# Patient Record
Sex: Male | Born: 1965 | Race: White | Hispanic: No | Marital: Single | Smoking: Never smoker
Health system: Southern US, Community
[De-identification: ages and names within clinical notes are randomized; demographics above are authoritative.]

## PROBLEM LIST (undated history)

## (undated) DIAGNOSIS — J45909 Unspecified asthma, uncomplicated: Secondary | ICD-10-CM

## (undated) DIAGNOSIS — H52209 Unspecified astigmatism, unspecified eye: Secondary | ICD-10-CM

## (undated) DIAGNOSIS — F32A Depression, unspecified: Secondary | ICD-10-CM

## (undated) DIAGNOSIS — M479 Spondylosis, unspecified: Secondary | ICD-10-CM

## (undated) DIAGNOSIS — F1120 Opioid dependence, uncomplicated: Secondary | ICD-10-CM

## (undated) DIAGNOSIS — R4183 Borderline intellectual functioning: Secondary | ICD-10-CM

## (undated) DIAGNOSIS — F609 Personality disorder, unspecified: Secondary | ICD-10-CM

## (undated) DIAGNOSIS — H5210 Myopia, unspecified eye: Secondary | ICD-10-CM

## (undated) DIAGNOSIS — M5030 Other cervical disc degeneration, unspecified cervical region: Secondary | ICD-10-CM

## (undated) DIAGNOSIS — F329 Major depressive disorder, single episode, unspecified: Secondary | ICD-10-CM

## (undated) DIAGNOSIS — M109 Gout, unspecified: Secondary | ICD-10-CM

## (undated) DIAGNOSIS — F909 Attention-deficit hyperactivity disorder, unspecified type: Secondary | ICD-10-CM

## (undated) DIAGNOSIS — F341 Dysthymic disorder: Secondary | ICD-10-CM

## (undated) HISTORY — PX: FOOT SURGERY: SHX648

## (undated) HISTORY — DX: Borderline intellectual functioning: R41.83

## (undated) HISTORY — PX: BACK SURGERY: SHX140

## (undated) HISTORY — DX: Attention-deficit hyperactivity disorder, unspecified type: F90.9

## (undated) HISTORY — DX: Myopia, unspecified eye: H52.10

## (undated) HISTORY — DX: Spondylosis, unspecified: M47.9

## (undated) HISTORY — DX: Major depressive disorder, single episode, unspecified: F32.9

## (undated) HISTORY — DX: Personality disorder, unspecified: F60.9

## (undated) HISTORY — DX: Opioid dependence, uncomplicated: F11.20

## (undated) HISTORY — DX: Unspecified asthma, uncomplicated: J45.909

## (undated) HISTORY — DX: Depression, unspecified: F32.A

## (undated) HISTORY — DX: Unspecified astigmatism, unspecified eye: H52.209

## (undated) HISTORY — DX: Dysthymic disorder: F34.1

## (undated) HISTORY — DX: Other cervical disc degeneration, unspecified cervical region: M50.30

---

## 1998-12-21 ENCOUNTER — Emergency Department (HOSPITAL_COMMUNITY): Admission: EM | Admit: 1998-12-21 | Discharge: 1998-12-21 | Payer: Self-pay | Admitting: Emergency Medicine

## 1999-01-13 ENCOUNTER — Emergency Department (HOSPITAL_COMMUNITY): Admission: EM | Admit: 1999-01-13 | Discharge: 1999-01-13 | Payer: Self-pay | Admitting: Emergency Medicine

## 1999-10-10 ENCOUNTER — Encounter: Admission: RE | Admit: 1999-10-10 | Discharge: 1999-10-10 | Payer: Self-pay | Admitting: Internal Medicine

## 1999-10-17 ENCOUNTER — Encounter: Admission: RE | Admit: 1999-10-17 | Discharge: 1999-10-17 | Payer: Self-pay | Admitting: Internal Medicine

## 1999-10-22 ENCOUNTER — Ambulatory Visit (HOSPITAL_COMMUNITY): Admission: RE | Admit: 1999-10-22 | Discharge: 1999-10-22 | Payer: Self-pay | Admitting: *Deleted

## 1999-12-28 ENCOUNTER — Encounter: Admission: RE | Admit: 1999-12-28 | Discharge: 1999-12-28 | Payer: Self-pay | Admitting: Internal Medicine

## 2000-02-13 ENCOUNTER — Encounter: Admission: RE | Admit: 2000-02-13 | Discharge: 2000-02-13 | Payer: Self-pay | Admitting: Internal Medicine

## 2000-03-12 ENCOUNTER — Encounter: Admission: RE | Admit: 2000-03-12 | Discharge: 2000-03-12 | Payer: Self-pay | Admitting: Internal Medicine

## 2000-04-04 ENCOUNTER — Encounter: Admission: RE | Admit: 2000-04-04 | Discharge: 2000-04-04 | Payer: Self-pay | Admitting: Hematology and Oncology

## 2000-04-29 ENCOUNTER — Encounter: Admission: RE | Admit: 2000-04-29 | Discharge: 2000-04-29 | Payer: Self-pay | Admitting: Internal Medicine

## 2003-01-27 ENCOUNTER — Emergency Department (HOSPITAL_COMMUNITY): Admission: EM | Admit: 2003-01-27 | Discharge: 2003-01-27 | Payer: Self-pay | Admitting: Emergency Medicine

## 2003-01-31 ENCOUNTER — Emergency Department (HOSPITAL_COMMUNITY): Admission: EM | Admit: 2003-01-31 | Discharge: 2003-01-31 | Payer: Self-pay | Admitting: Emergency Medicine

## 2003-01-31 ENCOUNTER — Encounter: Payer: Self-pay | Admitting: Emergency Medicine

## 2003-02-01 ENCOUNTER — Emergency Department (HOSPITAL_COMMUNITY): Admission: EM | Admit: 2003-02-01 | Discharge: 2003-02-01 | Payer: Self-pay | Admitting: Emergency Medicine

## 2003-02-05 ENCOUNTER — Encounter: Payer: Self-pay | Admitting: Emergency Medicine

## 2003-02-05 ENCOUNTER — Emergency Department (HOSPITAL_COMMUNITY): Admission: EM | Admit: 2003-02-05 | Discharge: 2003-02-05 | Payer: Self-pay | Admitting: Emergency Medicine

## 2003-02-18 ENCOUNTER — Emergency Department (HOSPITAL_COMMUNITY): Admission: EM | Admit: 2003-02-18 | Discharge: 2003-02-18 | Payer: Self-pay | Admitting: Emergency Medicine

## 2003-03-08 ENCOUNTER — Encounter: Admission: RE | Admit: 2003-03-08 | Discharge: 2003-03-08 | Payer: Self-pay | Admitting: Family Medicine

## 2003-03-18 ENCOUNTER — Encounter: Admission: RE | Admit: 2003-03-18 | Discharge: 2003-03-18 | Payer: Self-pay | Admitting: Family Medicine

## 2003-03-23 ENCOUNTER — Encounter: Admission: RE | Admit: 2003-03-23 | Discharge: 2003-03-23 | Payer: Self-pay | Admitting: Family Medicine

## 2003-03-26 ENCOUNTER — Encounter: Admission: RE | Admit: 2003-03-26 | Discharge: 2003-03-26 | Payer: Self-pay | Admitting: Sports Medicine

## 2003-04-01 ENCOUNTER — Encounter: Admission: RE | Admit: 2003-04-01 | Discharge: 2003-04-01 | Payer: Self-pay | Admitting: Family Medicine

## 2003-04-26 ENCOUNTER — Encounter: Admission: RE | Admit: 2003-04-26 | Discharge: 2003-07-25 | Payer: Self-pay | Admitting: Family Medicine

## 2003-05-06 ENCOUNTER — Encounter: Admission: RE | Admit: 2003-05-06 | Discharge: 2003-05-06 | Payer: Self-pay | Admitting: Family Medicine

## 2003-05-20 ENCOUNTER — Encounter: Admission: RE | Admit: 2003-05-20 | Discharge: 2003-05-20 | Payer: Self-pay | Admitting: Family Medicine

## 2003-06-03 ENCOUNTER — Encounter: Admission: RE | Admit: 2003-06-03 | Discharge: 2003-06-03 | Payer: Self-pay | Admitting: Family Medicine

## 2003-08-18 ENCOUNTER — Emergency Department (HOSPITAL_COMMUNITY): Admission: EM | Admit: 2003-08-18 | Discharge: 2003-08-18 | Payer: Self-pay | Admitting: Emergency Medicine

## 2003-11-11 ENCOUNTER — Emergency Department (HOSPITAL_COMMUNITY): Admission: EM | Admit: 2003-11-11 | Discharge: 2003-11-11 | Payer: Self-pay | Admitting: Emergency Medicine

## 2004-05-02 ENCOUNTER — Ambulatory Visit: Payer: Self-pay | Admitting: Pediatric Critical Care Medicine

## 2004-05-17 ENCOUNTER — Emergency Department (HOSPITAL_COMMUNITY): Admission: AC | Admit: 2004-05-17 | Discharge: 2004-05-17 | Payer: Self-pay

## 2004-07-07 ENCOUNTER — Emergency Department (HOSPITAL_COMMUNITY): Admission: EM | Admit: 2004-07-07 | Discharge: 2004-07-07 | Payer: Self-pay | Admitting: Emergency Medicine

## 2005-06-04 ENCOUNTER — Emergency Department (HOSPITAL_COMMUNITY): Admission: EM | Admit: 2005-06-04 | Discharge: 2005-06-04 | Payer: Self-pay | Admitting: Emergency Medicine

## 2006-11-14 DIAGNOSIS — G609 Hereditary and idiopathic neuropathy, unspecified: Secondary | ICD-10-CM | POA: Insufficient documentation

## 2006-11-14 DIAGNOSIS — E739 Lactose intolerance, unspecified: Secondary | ICD-10-CM

## 2006-11-14 DIAGNOSIS — M1990 Unspecified osteoarthritis, unspecified site: Secondary | ICD-10-CM | POA: Insufficient documentation

## 2006-11-14 DIAGNOSIS — E669 Obesity, unspecified: Secondary | ICD-10-CM

## 2006-11-14 DIAGNOSIS — R740 Nonspecific elevation of levels of transaminase and lactic acid dehydrogenase [LDH]: Secondary | ICD-10-CM

## 2006-11-14 DIAGNOSIS — M109 Gout, unspecified: Secondary | ICD-10-CM | POA: Insufficient documentation

## 2006-11-14 DIAGNOSIS — H9190 Unspecified hearing loss, unspecified ear: Secondary | ICD-10-CM | POA: Insufficient documentation

## 2007-09-09 ENCOUNTER — Emergency Department (HOSPITAL_COMMUNITY): Admission: EM | Admit: 2007-09-09 | Discharge: 2007-09-10 | Payer: Self-pay | Admitting: Emergency Medicine

## 2011-06-22 LAB — DIFFERENTIAL
Eosinophils Absolute: 0 — ABNORMAL LOW
Lymphocytes Relative: 23
Monocytes Absolute: 0.6
Neutro Abs: 6

## 2011-06-22 LAB — CBC
Hemoglobin: 14.6
Platelets: 192
RDW: 13.1
WBC: 8.7

## 2011-06-22 LAB — COMPREHENSIVE METABOLIC PANEL
AST: 45 — ABNORMAL HIGH
Alkaline Phosphatase: 60
BUN: 7
CO2: 28
Calcium: 9.9
GFR calc Af Amer: >60
Potassium: 3.4 — ABNORMAL LOW
Total Protein: 7.6

## 2011-06-22 LAB — RAPID URINE DRUG SCREEN, HOSP PERFORMED: Barbiturates: NOT DETECTED

## 2013-01-14 ENCOUNTER — Emergency Department (HOSPITAL_COMMUNITY)
Admission: EM | Admit: 2013-01-14 | Discharge: 2013-01-15 | Disposition: A | Discharge status: Home / Self Care | Payer: Medicare Other | Attending: Emergency Medicine | Admitting: Emergency Medicine

## 2013-01-14 ENCOUNTER — Encounter (HOSPITAL_COMMUNITY): Payer: Self-pay | Admitting: *Deleted

## 2013-01-14 DIAGNOSIS — M25469 Effusion, unspecified knee: Secondary | ICD-10-CM | POA: Insufficient documentation

## 2013-01-14 DIAGNOSIS — R5381 Other malaise: Secondary | ICD-10-CM | POA: Insufficient documentation

## 2013-01-14 DIAGNOSIS — M25569 Pain in unspecified knee: Secondary | ICD-10-CM | POA: Insufficient documentation

## 2013-01-14 DIAGNOSIS — R5383 Other fatigue: Secondary | ICD-10-CM | POA: Insufficient documentation

## 2013-01-14 DIAGNOSIS — R109 Unspecified abdominal pain: Secondary | ICD-10-CM | POA: Insufficient documentation

## 2013-01-14 DIAGNOSIS — R51 Headache: Secondary | ICD-10-CM | POA: Insufficient documentation

## 2013-01-14 DIAGNOSIS — R000 Tachycardia, unspecified: Secondary | ICD-10-CM | POA: Insufficient documentation

## 2013-01-14 DIAGNOSIS — R509 Fever, unspecified: Secondary | ICD-10-CM | POA: Insufficient documentation

## 2013-01-14 DIAGNOSIS — M25579 Pain in unspecified ankle and joints of unspecified foot: Secondary | ICD-10-CM | POA: Insufficient documentation

## 2013-01-14 DIAGNOSIS — M109 Gout, unspecified: Secondary | ICD-10-CM | POA: Insufficient documentation

## 2013-01-14 HISTORY — DX: Gout, unspecified: M10.9

## 2013-01-14 LAB — CBC WITH DIFFERENTIAL/PLATELET
Basophils Absolute: 0 10*3/uL (ref 0.0–0.1)
Eosinophils Absolute: 0 10*3/uL (ref 0.0–0.7)
Eosinophils Relative: 0 % (ref 0–5)
HCT: 44.2 % (ref 39.0–52.0)
Lymphocytes Relative: 12 % (ref 12–46)
Lymphs Abs: 1.4 10*3/uL (ref 0.7–4.0)
MCH: 28.4 pg (ref 26.0–34.0)
MCV: 84.4 fL (ref 78.0–100.0)
Neutro Abs: 8.8 10*3/uL — ABNORMAL HIGH (ref 1.7–7.7)
Neutrophils Relative %: 76 % (ref 43–77)
Platelets: 192 10*3/uL (ref 150–400)
RDW: 12.9 % (ref 11.5–15.5)

## 2013-01-14 LAB — COMPREHENSIVE METABOLIC PANEL
AST: 30 U/L (ref 0–37)
Albumin: 3.9 g/dL (ref 3.5–5.2)
Alkaline Phosphatase: 89 U/L (ref 39–117)
Chloride: 97 mEq/L (ref 96–112)
Creatinine, Ser: 0.83 mg/dL (ref 0.50–1.35)
GFR calc non Af Amer: >90 mL/min (ref >90)
Potassium: 4.5 mEq/L (ref 3.5–5.1)
Total Bilirubin: 0.8 mg/dL (ref 0.3–1.2)

## 2013-01-14 MED ORDER — KETOROLAC TROMETHAMINE 30 MG/ML IJ SOLN
30.0000 mg | Freq: Once | INTRAMUSCULAR | Status: AC
  Administered 2013-01-14: 30 mg via INTRAVENOUS
  Filled 2013-01-14: qty 1

## 2013-01-14 MED ORDER — SODIUM CHLORIDE 0.9 % IV BOLUS (SEPSIS)
1000.0000 mL | Freq: Once | INTRAVENOUS | Status: AC
  Administered 2013-01-14: 1000 mL via INTRAVENOUS

## 2013-01-14 MED ORDER — HYDROCODONE-ACETAMINOPHEN 5-325 MG PO TABS: 1.0000 | ORAL_TABLET | ORAL | Status: DC | PRN

## 2013-01-14 MED ORDER — MORPHINE SULFATE 4 MG/ML IJ SOLN
4.0000 mg | Freq: Once | INTRAMUSCULAR | Status: AC
  Administered 2013-01-14: 4 mg via INTRAVENOUS
  Filled 2013-01-14: qty 1

## 2013-01-14 MED ORDER — INDOMETHACIN 25 MG PO CAPS: 25.0000 mg | ORAL_CAPSULE | Freq: Three times a day (TID) | ORAL | Status: DC | PRN

## 2013-01-14 MED ORDER — ALLOPURINOL 100 MG PO TABS: 100.0000 mg | ORAL_TABLET | Freq: Every day | ORAL | Status: DC

## 2013-01-14 NOTE — ED Provider Notes (Signed)
History     CSN: [id number]  Arrival date & time 01/14/13  1191   First MD Initiated Contact with Patient 01/14/13 2020      Chief Complaint  Patient presents with  . Gout    (Consider location/radiation/quality/duration/timing/severity/associated sxs/prior treatment) HPI Pt with 3 weeks of waxing and waning L knee pain, swelling, warmth. Pt has history of gout with similar symptoms. States he has been trying alternative therapies with some success. Pain has worsened in the past few days and now he is having pain in the MTP joint of first digit of the left foot. +fever and chills.  Past Medical History  Diagnosis Date  . Gout     Past Surgical History  Procedure Laterality Date  . Back surgery      2006    History reviewed. No pertinent family history.  History  Substance Use Topics  . Smoking status: Not on file  . Smokeless tobacco: Not on file  . Alcohol Use: Yes      Review of Systems  Constitutional: Positive for fever, chills and fatigue.  HENT: Negative for neck pain and neck stiffness.   Respiratory: Negative for shortness of breath.   Cardiovascular: Negative for chest pain.  Gastrointestinal: Positive for abdominal pain. Negative for nausea, vomiting and diarrhea.  Musculoskeletal: Negative for myalgias and back pain.  Skin: Negative for rash and wound.  Neurological: Positive for headaches. Negative for dizziness, seizures, syncope, weakness and numbness.  All other systems reviewed and are negative.    Allergies  Aspirin adult low  Home Medications   Current Outpatient Rx  Name  Route  Sig  Dispense  Refill  . phenyltoloxamine-acetaminophen 30-325 MG per tablet   Oral   Take 2 tablets by mouth 3 (three) times daily as needed for pain.         Marland Kitchen allopurinol (ZYLOPRIM) 100 MG tablet   Oral   Take 1 tablet (100 mg total) by mouth daily.   15 tablet   0   . HYDROcodone-acetaminophen (NORCO) 5-325 MG per tablet   Oral   Take 1 tablet by  mouth every 4 (four) hours as needed for pain.   15 tablet   0   . indomethacin (INDOCIN) 25 MG capsule   Oral   Take 1 capsule (25 mg total) by mouth 3 (three) times daily as needed.   30 capsule   0     BP 109/78  Pulse 96  Temp(Src) 100.1 F (37.8 C) (Oral)  Resp 16  SpO2 94%  Physical Exam  Nursing note and vitals reviewed. Constitutional: He is oriented to person, place, and time. He appears well-developed and well-nourished. No distress.  HENT:  Head: Normocephalic and atraumatic.  Mouth/Throat: Oropharynx is clear and moist.  Eyes: EOM are normal. Pupils are equal, round, and reactive to light.  Neck: Normal range of motion. Neck supple.  No meningismus   Cardiovascular: Regular rhythm.   tachycardia  Pulmonary/Chest: Effort normal and breath sounds normal. No respiratory distress. He has no wheezes. He has no rales. He exhibits no tenderness.  Abdominal: Soft. Bowel sounds are normal. He exhibits no distension. There is tenderness (mild LUQ TTP. No rebound or guarding).  Musculoskeletal: Normal range of motion. He exhibits tenderness (TTP and with ROM of L knee and R MTP of 1st digit of foot. +swelling and warmth). He exhibits no edema.  Neurological: He is alert and oriented to person, place, and time.  5/5 motor,sensation intact  Skin:  Skin is warm and dry. No rash noted. No erythema.  Psychiatric: He has a normal mood and affect. His behavior is normal.    ED Course  Procedures (including critical care time)  Labs Reviewed  COMPREHENSIVE METABOLIC PANEL - Abnormal; Notable for the following:    Glucose, Bld 116 (*)    All other components within normal limits  CBC WITH DIFFERENTIAL - Abnormal; Notable for the following:    WBC 11.6 (*)    Neutro Abs 8.8 (*)    Monocytes Absolute 1.4 (*)    All other components within normal limits  URIC ACID   No results found.   1. Gout flare       MDM   HR improved with IVF NSAIDS. Pt states pain is  improved.        Loren Racer, MD 01/14/13 [phone]

## 2013-01-14 NOTE — ED Notes (Signed)
Pt c/o pain to left knee and left ankle. Reports diagnosed with gout in 2003.

## 2013-01-14 NOTE — ED Notes (Signed)
Pt arrives from home by EMS. Pt c/o gout pain to left knee and right ankle.

## 2013-01-30 ENCOUNTER — Ambulatory Visit: Payer: Medicare Other | Attending: Family Medicine | Admitting: Family Medicine

## 2013-01-30 VITALS — BP 121/78 | HR 99 | Temp 99.0°F | Resp 18 | Wt 250.0 lb

## 2013-01-30 DIAGNOSIS — M1990 Unspecified osteoarthritis, unspecified site: Secondary | ICD-10-CM

## 2013-01-30 DIAGNOSIS — E669 Obesity, unspecified: Secondary | ICD-10-CM

## 2013-01-30 DIAGNOSIS — R7309 Other abnormal glucose: Secondary | ICD-10-CM

## 2013-01-30 DIAGNOSIS — E739 Lactose intolerance, unspecified: Secondary | ICD-10-CM

## 2013-01-30 DIAGNOSIS — M79674 Pain in right toe(s): Secondary | ICD-10-CM

## 2013-01-30 DIAGNOSIS — M25562 Pain in left knee: Secondary | ICD-10-CM

## 2013-01-30 DIAGNOSIS — R7303 Prediabetes: Secondary | ICD-10-CM

## 2013-01-30 DIAGNOSIS — R739 Hyperglycemia, unspecified: Secondary | ICD-10-CM

## 2013-01-30 DIAGNOSIS — M2390 Unspecified internal derangement of unspecified knee: Secondary | ICD-10-CM

## 2013-01-30 DIAGNOSIS — M109 Gout, unspecified: Secondary | ICD-10-CM

## 2013-01-30 DIAGNOSIS — R7401 Elevation of levels of liver transaminase levels: Secondary | ICD-10-CM

## 2013-01-30 DIAGNOSIS — M25569 Pain in unspecified knee: Secondary | ICD-10-CM

## 2013-01-30 DIAGNOSIS — M79609 Pain in unspecified limb: Secondary | ICD-10-CM

## 2013-01-30 DIAGNOSIS — M2392 Unspecified internal derangement of left knee: Secondary | ICD-10-CM

## 2013-01-30 MED ORDER — HYDROCODONE-ACETAMINOPHEN 5-325 MG PO TABS: 1.0000 | ORAL_TABLET | Freq: Three times a day (TID) | ORAL | Status: DC | PRN

## 2013-01-30 MED ORDER — PREDNISONE 20 MG PO TABS: ORAL_TABLET | ORAL | Status: DC

## 2013-01-30 MED ORDER — METHYLPREDNISOLONE SODIUM SUCC 125 MG IJ SOLR
125.0000 mg | Freq: Once | INTRAMUSCULAR | Status: AC
  Administered 2013-01-30: 125 mg via INTRAMUSCULAR

## 2013-01-30 MED ORDER — METHYLPREDNISOLONE SODIUM SUCC 1000 MG IJ SOLR: 125.0000 mg | Freq: Once | INTRAMUSCULAR | Status: DC

## 2013-01-30 NOTE — Progress Notes (Signed)
Patient ID: Drew [name], male   DOB: November 23, 1965, 47 y.o.   MRN: [id number] CC:  Gout attack   HPI: Patient states suffers from gout and was recently treated in ER and started on allopurinol and indocin. Has had a flair up in his left knee for about a month.  He treated it at home and then he went to ER after symptoms lasted 2 weeks at home.  He was treated in ER and improved with IVFs and pain meds. He started allopurinol and indocin and reported that he had a recurrent flare up involving right great toe and left knee continues to hurt, remain swollen and decreased mobility (although improved from when he was in ER) it has not improved to normal.  He says that he has had knee injections of steroids in the past from gout.  He says he currently does not have an orthopedist and has not seen a primary care provider in a very long time.  He denies other known medical problems.  He denies fever and chills.      Allergies  Allergen Reactions  . Aspirin Adult Low (Aspirin) Nausea Only   Past Medical History  Diagnosis Date  . Gout    Current Outpatient Prescriptions on File Prior to Visit  Medication Sig Dispense Refill  . allopurinol (ZYLOPRIM) 100 MG tablet Take 1 tablet (100 mg total) by mouth daily.  15 tablet  0   No current facility-administered medications on file prior to visit.   No family history on file. History   Social History  . Marital Status: Single    Spouse Name: N/A    Number of Children: N/A  . Years of Education: N/A   Occupational History  . Not on file.   Social History Main Topics  . Smoking status: Not on file  . Smokeless tobacco: Not on file  . Alcohol Use: Yes  . Drug Use: No  . Sexually Active: Not on file   Other Topics Concern  . Not on file   Social History Narrative  . No narrative on file    Review of Systems  Constitutional: Negative for fever, chills, diaphoresis, activity change, appetite change and fatigue.  HENT: Negative for ear pain,  nosebleeds, congestion, facial swelling, rhinorrhea, neck pain, neck stiffness and ear discharge.   Eyes: Negative for pain, discharge, redness, itching and visual disturbance.  Respiratory: Negative for cough, choking, chest tightness, shortness of breath, wheezing and stridor.   Cardiovascular: Negative for chest pain, palpitations and leg swelling.  Gastrointestinal: Negative for abdominal distention.  Genitourinary: Negative for dysuria, urgency, frequency, hematuria, flank pain, decreased urine volume, difficulty urinating and dyspareunia.  Musculoskeletal: pain and swelling in left knee right 1st toe Neurological: Negative for dizziness, tremors, seizures, syncope, facial asymmetry, speech difficulty, weakness, light-headedness, numbness and headaches.  Hematological: Negative for adenopathy. Does not bruise/bleed easily.  Psychiatric/Behavioral: Negative for hallucinations, behavioral problems, confusion, dysphoric mood, decreased concentration and agitation.    Objective:   Filed Vitals:   01/30/13 1611  BP: 121/78  Pulse: 99  Temp: 99 F (37.2 C)  Resp: 18   Physical Exam  Constitutional: Appears well-developed and well-nourished. No distress.  HENT: Normocephalic. External right and left ear normal. Oropharynx is clear and moist.  Eyes: Conjunctivae and EOM are normal. PERRLA, no scleral icterus.  Neck: Normal ROM. Neck supple. No JVD. No tracheal deviation. No thyromegaly.  CVS: RRR, S1/S2 +, no murmurs, no gallops, no carotid bruit.  Pulmonary: Effort and  breath sounds normal, no stridor, rhonchi, wheezes, rales.  Abdominal: Soft. BS +,  no distension, tenderness, rebound or guarding.  Musculoskeletal: limited mobility of left knee due to pain, mild edema of left knee, no redness seen, mild warmth of inflammation of left knee, He exhibits tenderness R MTP of 1st digit of foot.   Lymphadenopathy: No lymphadenopathy noted, cervical, inguinal. Neuro: Alert. Normal reflexes,  muscle tone coordination. No cranial nerve deficit. Skin: Skin is warm and dry. No rash noted. Not diaphoretic. No erythema. No pallor.  Psychiatric: Normal mood and affect. Behavior, judgment, thought content normal.   Lab Results  Component Value Date   WBC 11.6* 01/14/2013   HGB 14.9 01/14/2013   HCT 44.2 01/14/2013   MCV 84.4 01/14/2013   PLT 192 01/14/2013   Lab Results  Component Value Date   CREATININE 0.83 01/14/2013   BUN 8 01/14/2013   NA 140 01/14/2013   K 4.5 01/14/2013   CL 97 01/14/2013   CO2 30 01/14/2013    No results found for this basename: HGBA1C     Assessment:   Patient Active Problem List   Diagnosis Date Noted  . GLUCOSE INTOLERANCE 11/14/2006  . GOUT, ACUTE 11/14/2006  . OBESITY, NOS 11/14/2006  . NEUROPATHY, PERIPHERAL 11/14/2006  . HEARING LOSS NOS OR DEAFNESS 11/14/2006  . DJD, UNSPECIFIED 11/14/2006  . TRANSAMINESES ELEVATED 11/14/2006        Plan:        Gout attack - Plan: methylPREDNISolone sodium succinate (SOLU-MEDROL) 125 mg IM, check labs.  I had a long discussion with patient that if he develops fever, chills, nausea or vomiting or worsening pain or swelling that he should immediately go to ER.  The patient verbalized understanding.  I have recommended that he see an orthopedist for further evaluation and for possible knee aspiration.  I am placing him on a short prednisone course. DC indomethacin and DC allopurinol at this time.  Check more labs today.  Arrange for close follow up in our office.   The patient was given clear instructions to go to ER or return to medical center if symptoms don't improve, worsen or new problems develop.  The patient verbalized understanding.  The patient was told to call to get lab results if they haven't heard anything in the next week.    Results for orders placed in visit on 01/30/13  URIC ACID      Result Value Range   Uric Acid, Serum 7.4  4.0 - 7.8 mg/dL  HEMOGLOBIN J1B      Result Value Range    Hemoglobin A1C 5.9 (*) <5.7 %   Mean Plasma Glucose 123 (*) <117 mg/dL  COMPLETE METABOLIC PANEL WITH GFR      Result Value Range   Sodium 139  135 - 145 mEq/L   Potassium 4.1  3.5 - 5.3 mEq/L   Chloride 101  96 - 112 mEq/L   CO2 27  19 - 32 mEq/L   Glucose, Bld 89  70 - 99 mg/dL   BUN 10  6 - 23 mg/dL   Creat 1.47  8.29 - 5.62 mg/dL   Total Bilirubin 0.7  0.3 - 1.2 mg/dL   Alkaline Phosphatase 86  39 - 117 U/L   AST 57 (*) 0 - 37 U/L   ALT 63 (*) 0 - 53 U/L   Total Protein 7.8  6.0 - 8.3 g/dL   Albumin 4.5  3.5 - 5.2 g/dL   Calcium 10.2  8.4 - 10.5 mg/dL   GFR, Est African American >89     GFR, Est Non African American >89

## 2013-01-30 NOTE — Patient Instructions (Signed)
Stop taking the allopurinol and stop taking the indomethacin  Gout Gout is an inflammatory condition (arthritis) caused by a buildup of uric acid crystals in the joints. Uric acid is a chemical that is normally present in the blood. Under some circumstances, uric acid can form into crystals in your joints. This causes joint redness, soreness, and swelling (inflammation). Repeat attacks are common. Over time, uric acid crystals can form into masses (tophi) near a joint, causing disfigurement. Gout is treatable and often preventable. CAUSES  The disease begins with elevated levels of uric acid in the blood. Uric acid is produced by your body when it breaks down a naturally found substance called purines. This also happens when you eat certain foods such as meats and fish. Causes of an elevated uric acid level include:  Being passed down from parent to child (heredity).  Diseases that cause increased uric acid production (obesity, psoriasis, some cancers).  Excessive alcohol use.  Diet, especially diets rich in meat and seafood.  Medicines, including certain cancer-fighting drugs (chemotherapy), diuretics, and aspirin.  Chronic kidney disease. The kidneys are no longer able to remove uric acid well.  Problems with metabolism. Conditions strongly associated with gout include:  Obesity.  High blood pressure.  High cholesterol.  Diabetes. Not everyone with elevated uric acid levels gets gout. It is not understood why some people get gout and others do not. Surgery, joint injury, and eating too much of certain foods are some of the factors that can lead to gout. SYMPTOMS   An attack of gout comes on quickly. It causes intense pain with redness, swelling, and warmth in a joint.  Fever can occur.  Often, only one joint is involved. Certain joints are more commonly involved:  Base of the big toe.  Knee.  Ankle.  Wrist.  Finger. Without treatment, an attack usually goes away in a  few days to weeks. Between attacks, you usually will not have symptoms, which is different from many other forms of arthritis. DIAGNOSIS  Your caregiver will suspect gout based on your symptoms and exam. Removal of fluid from the joint (arthrocentesis) is done to check for uric acid crystals. Your caregiver will give you a medicine that numbs the area (local anesthetic) and use a needle to remove joint fluid for exam. Gout is confirmed when uric acid crystals are seen in joint fluid, using a special microscope. Sometimes, blood, urine, and X-ray tests are also used. TREATMENT  There are 2 phases to gout treatment: treating the sudden onset (acute) attack and preventing attacks (prophylaxis). Treatment of an Acute Attack  Medicines are used. These include anti-inflammatory medicines or steroid medicines.  An injection of steroid medicine into the affected joint is sometimes necessary.  The painful joint is rested. Movement can worsen the arthritis.  You may use warm or cold treatments on painful joints, depending which works best for you.  Discuss the use of coffee, vitamin C, or cherries with your caregiver. These may be helpful treatment options. Treatment to Prevent Attacks After the acute attack subsides, your caregiver may advise prophylactic medicine. These medicines either help your kidneys eliminate uric acid from your body or decrease your uric acid production. You may need to stay on these medicines for a very long time. The early phase of treatment with prophylactic medicine can be associated with an increase in acute gout attacks. For this reason, during the first few months of treatment, your caregiver may also advise you to take medicines usually used for  acute gout treatment. Be sure you understand your caregiver's directions. You should also discuss dietary treatment with your caregiver. Certain foods such as meats and fish can increase uric acid levels. Other foods such as dairy  can decrease levels. Your caregiver can give you a list of foods to avoid. HOME CARE INSTRUCTIONS   Do not take aspirin to relieve pain. This raises uric acid levels.  Only take over-the-counter or prescription medicines for pain, discomfort, or fever as directed by your caregiver.  Rest the joint as much as possible. When in bed, keep sheets and blankets off painful areas.  Keep the affected joint raised (elevated).  Use crutches if the painful joint is in your leg.  Drink enough water and fluids to keep your urine clear or pale yellow. This helps your body get rid of uric acid. Do not drink alcoholic beverages. They slow the passage of uric acid.  Follow your caregiver's dietary instructions. Pay careful attention to the amount of protein you eat. Your daily diet should emphasize fruits, vegetables, whole grains, and fat-free or low-fat milk products.  Maintain a healthy body weight. SEEK MEDICAL CARE IF:   You have an oral temperature above 102 F (38.9 C).  You develop diarrhea, vomiting, or any side effects from medicines.  You do not feel better in 24 hours, or you are getting worse. SEEK IMMEDIATE MEDICAL CARE IF:   Your joint becomes suddenly more tender and you have:  Chills.  An oral temperature above 102 F (38.9 C), not controlled by medicine. MAKE SURE YOU:   Understand these instructions.  Will watch your condition.  Will get help right away if you are not doing well or get worse. Document Released: 08/31/2000 Document Revised: 11/26/2011 Document Reviewed: 12/12/2009 [facility] Patient Information 2013 [place], [place].

## 2013-01-30 NOTE — Progress Notes (Signed)
Patient states no reaction to solumedrol injection.

## 2013-01-30 NOTE — Progress Notes (Signed)
Patient states suffers from gout Has had a flair up in his left knee for about a month Also suffers from gout in his great right tow

## 2013-01-31 DIAGNOSIS — R7303 Prediabetes: Secondary | ICD-10-CM | POA: Insufficient documentation

## 2013-01-31 DIAGNOSIS — M25562 Pain in left knee: Secondary | ICD-10-CM | POA: Insufficient documentation

## 2013-01-31 LAB — COMPLETE METABOLIC PANEL WITH GFR
ALT: 63 U/L — ABNORMAL HIGH (ref 0–53)
Albumin: 4.5 g/dL (ref 3.5–5.2)
CO2: 27 mEq/L (ref 19–32)
Calcium: 10.2 mg/dL (ref 8.4–10.5)
Chloride: 101 mEq/L (ref 96–112)
Creat: 0.86 mg/dL (ref 0.50–1.35)
GFR, Est African American: >89 mL/min
GFR, Est Non African American: >89 mL/min
Potassium: 4.1 mEq/L (ref 3.5–5.3)

## 2013-01-31 NOTE — Progress Notes (Addendum)
Quick Note:  Please inform patient that his liver enzymes are elevated. This is most likely from the allopurinol that he was taking. He should have stopped taking that. His A1c was elevated suggesting that he does have prediabetes. He should start a low carb diet and increase physical activity. His uric acid level was within normal limits. Please send for his old records from his previous providers.   Rodney Langton, MD, CDE, FAAFP [facility] [place], [place]   ______

## 2013-02-02 ENCOUNTER — Telehealth: Payer: Self-pay

## 2013-02-02 NOTE — Telephone Encounter (Signed)
Left message to return out call 

## 2013-02-04 ENCOUNTER — Ambulatory Visit (HOSPITAL_BASED_OUTPATIENT_CLINIC_OR_DEPARTMENT_OTHER): Payer: Medicare Other | Admitting: Family Medicine

## 2013-02-04 ENCOUNTER — Ambulatory Visit
Admission: RE | Admit: 2013-02-04 | Discharge: 2013-02-04 | Disposition: A | Discharge status: Home / Self Care | Payer: Medicare Other | Source: Ambulatory Visit | Attending: Family Medicine | Admitting: Family Medicine

## 2013-02-04 VITALS — BP 140/84 | Ht 72.0 in | Wt 245.0 lb

## 2013-02-04 DIAGNOSIS — M25569 Pain in unspecified knee: Secondary | ICD-10-CM

## 2013-02-04 DIAGNOSIS — M25562 Pain in left knee: Secondary | ICD-10-CM

## 2013-02-04 NOTE — Patient Instructions (Addendum)
Thank you for coming in today. We will get xrays and try physical therapy.  Please come back in 1 month.  Come back sooner if not getting better.  If your gout returns after prednisone call here or your primary care doctor to start on Colchicine.

## 2013-02-04 NOTE — Progress Notes (Signed)
Drew [name] is a 47 y.o. male who presents to [facility] today for left knee pain. Patient has a personal history of gout. He typically has gout flares involving his left knee and right first MTP.   He had a gout flare started about one month ago. This involved his left knee. This became painful and developed an effusion. This was treated with indomethacin. Indomethacin works somewhat and he followed up with his primary care provider who switched to prednisone. He is currently partly throw a prednisone dose taper. He notes his knee pain and swelling have resolved however he cannot flex his left knee more than about 90 currently. He feels tension and pain on the anterior aspect of his knee with knee flexion. He feels well otherwise. He notes this happened previously and ultimately required manipulation under anesthesia. He denies any injury to his knee. He feels well otherwise.    PMH reviewed. Gout History  Substance Use Topics  . Smoking status: Not on file  . Smokeless tobacco: Not on file  . Alcohol Use: Yes   ROS as above otherwise neg   Exam:  BP 140/84  Ht 6' (1.829 m)  Wt 245 lb (111.131 kg)  BMI 33.22 kg/m2 Gen: Well NAD MSK: Left knee. Normal-appearing no erythema.  Mild effusion present Range of motion 0-90 No retropatellar crepitations on extension Nontender Stable ligamentous exam Extensor strength is intact.   No results found.

## 2013-02-04 NOTE — Assessment & Plan Note (Signed)
Unsure of etiology.  Patient is currently on a prednisone course therefore do not feel that corticosteroid injection would be very beneficial.  Plan for x-rays And trial of physical therapy Return in one month.  If not improved consider MRI to evaluate for meniscus injury

## 2013-02-05 ENCOUNTER — Other Ambulatory Visit: Payer: Self-pay | Admitting: Family Medicine

## 2013-02-11 ENCOUNTER — Ambulatory Visit: Payer: Medicare Other | Attending: Internal Medicine | Admitting: Internal Medicine

## 2013-02-11 ENCOUNTER — Encounter: Payer: Self-pay | Admitting: Internal Medicine

## 2013-02-11 VITALS — BP 127/93 | HR 69 | Temp 98.7°F | Resp 18 | Wt 253.4 lb

## 2013-02-11 DIAGNOSIS — M25569 Pain in unspecified knee: Secondary | ICD-10-CM

## 2013-02-11 DIAGNOSIS — M25562 Pain in left knee: Secondary | ICD-10-CM

## 2013-02-11 DIAGNOSIS — M109 Gout, unspecified: Secondary | ICD-10-CM | POA: Insufficient documentation

## 2013-02-11 MED ORDER — NAPROXEN 500 MG PO TABS: 500.0000 mg | ORAL_TABLET | Freq: Two times a day (BID) | ORAL | Status: AC

## 2013-02-11 MED ORDER — PREDNISONE 20 MG PO TABS: ORAL_TABLET | ORAL | Status: DC

## 2013-02-11 MED ORDER — HYDROCODONE-ACETAMINOPHEN 5-325 MG PO TABS: 1.0000 | ORAL_TABLET | Freq: Three times a day (TID) | ORAL | Status: AC | PRN

## 2013-02-11 MED ORDER — COLCHICINE 0.6 MG PO TABS: 0.6000 mg | ORAL_TABLET | Freq: Every day | ORAL | Status: AC

## 2013-02-11 NOTE — Progress Notes (Signed)
Patient ID: Drew Drew [name], male   DOB: Dec 09, 1965, 47 y.o.   MRN: [id number] Patient Demographics  Drew Drew [name], is a 47 y.o. male  NWG:[id number]  [id number]  DOB - [birth year]  Chief Complaint  Patient presents with  . Gout        Subjective:   Drew Drew [name] is here for a follow up visit. Patient has No headache, No chest pain, No abdominal pain - No Nausea, No new weakness tingling or numbness, No Cough - SOB. Patient is having acute gout attack in his left knee with no significant improvement after prednisone. Labs reviewed his serum uric acid was 7.4 on 01/30/2013. Allopurinol was discontinued due to mild transaminitis.He has an appointment with orthopedics, [name].   Objective:    Filed Vitals:   02/11/13 1306  BP: 127/93  Pulse: 69  Temp: 98.7 F (37.1 C)  Resp: 18  Weight: 253 lb 6.4 oz (114.941 kg)  SpO2: 97%     ALLERGIES:   Allergies  Allergen Reactions  . Aspirin Adult Low (Aspirin) Nausea Only    PAST MEDICAL HISTORY: Past Medical History  Diagnosis Date  . Gout     MEDICATIONS AT HOME: Prior to Admission medications   Medication Sig Start Date End Date Taking? Authorizing Provider  colchicine 0.6 MG tablet Take 1 tablet (0.6 mg total) by mouth daily. Take 2 tabs (1.2mg ) Drew [name], then 0.6mg  daily for 7 days 02/11/13   [name], MD  HYDROcodone-acetaminophen (NORCO) 5-325 MG per tablet Take 1 tablet by mouth every 8 (eight) hours as needed for pain. 02/11/13   [name], MD  naproxen (NAPROSYN) 500 MG tablet Take 1 tablet (500 mg total) by mouth 2 (two) times daily with a meal. 02/11/13   [name], MD  predniSONE (DELTASONE) 20 MG tablet Take 3 tabs PO QAM x3days, 2 tabs PO QAM x3days, 1tab PO QAM x3days 02/11/13   [name], MD     Exam  General appearance :Awake, alert, NAD, Speech Clear.  HEENT: Atraumatic and Normocephalic, PERLA Neck: supple, no JVD. No cervical lymphadenopathy.  Chest: Clear to  auscultation bilaterally, no wheezing, rales or rhonchi CVS: S1 S2 regular, no murmurs.  Abdomen: soft, NBS, NT, ND, no gaurding, rigidity or rebound. Extremities: no cyanosis or clubbing, left knee swollen, ROM decreased due to pain Neurology: Awake alert, and oriented X 3, CN II-XII intact, Non focal Skin: No Rash or lesions Wounds:N/A    Data Review   Basic Metabolic Panel: No results found for this basename: NA, K, CL, CO2, GLUCOSE, BUN, CREATININE, CALCIUM, MG, PHOS,  in the last 168 hours Liver Function Tests: No results found for this basename: AST, ALT, ALKPHOS, BILITOT, PROT, ALBUMIN,  in the last 168 hours  CBC: No results found for this basename: WBC, NEUTROABS, HGB, HCT, MCV, PLT,  in the last 168 hours  ------------------------------------------------------------------------------------------------------------------ No results found for this basename: HGBA1C,  in the last 72 hours ------------------------------------------------------------------------------------------------------------------ No results found for this basename: CHOL, HDL, LDLCALC, TRIG, CHOLHDL, LDLDIRECT,  in the last 72 hours ------------------------------------------------------------------------------------------------------------------ No results found for this basename: TSH, T4TOTAL, FREET3, T3FREE, THYROIDAB,  in the last 72 hours ------------------------------------------------------------------------------------------------------------------ No results found for this basename: VITAMINB12, FOLATE, FERRITIN, TIBC, IRON, RETICCTPCT,  in the last 72 hours  Coagulation profile  No results found for this basename: INR, PROTIME,  in the last 168 hours    Assessment & Plan   Active Problems: Acute gout of the left  knee pain - Placed on colchicine 0.6mg  X2 Drew [name], then continue daily - Placed on Naprosyn, percocet (#20, no refills) and prednisone taper - He has appointment with orthopedics, Dr.  Earma Reading on 02/18/13, patient advised to keep the appt   Follow-up in 3 months     [name] 02/11/2013, 1:37 PM

## 2013-02-11 NOTE — Progress Notes (Signed)
Patient states had a gout flair up to the left knee last night

## 2013-02-11 NOTE — Patient Instructions (Addendum)
Gout  Gout is caused by a buildup of uric acid crystals in the joints. The crystals make your joints sore. This is like having sand in your joints. Repeat attacks are common. Gout can be treated.  HOME CARE   · Do not take aspirin for pain.  · Only take medicine as told by your doctor.  · You may use cold treatments (ice) on painful joints.  · Put ice in a plastic bag.  · Place a towel between your skin and the bag.  · Leave the ice on for 15-20 minutes at a time, 3-4 times a day.  · Rest in bed as much as possible. When in bed, keep the sheets and blankets off your sore joints.  · Keep the sore joints raised (elevated).  · Use crutches if your legs or ankles hurt.  · Drink enough water and fluids to keep your pee (urine) clear or pale yellow. This helps your body get rid of uric acid. Do not drink alcohol.  · Follow diet instructions as told by your doctor.  · Keep your body at a healthy weight.  GET HELP RIGHT AWAY IF:   · You have a temperature by mouth above 102° F (38.9° C), not controlled by medicine.  · You have watery poop (diarrhea).  · You are throwing up (vomiting).  · You do not feel better in 1 day, or you are getting worse.  · Your joint hurts more.  · You have the chills.  MAKE SURE YOU:   · Understand these instructions.  · Will watch your condition.  · Will get help right away if you are not doing well or get worse.  Document Released: 06/12/2008 Document Revised: 11/26/2011 Document Reviewed: 12/12/2009  ExitCare® Patient Information ©2014 [employer].

## 2013-02-17 ENCOUNTER — Telehealth: Payer: Self-pay | Admitting: *Deleted

## 2013-02-18 ENCOUNTER — Ambulatory Visit: Payer: Medicare Other | Attending: Family Medicine

## 2013-02-18 DIAGNOSIS — IMO0001 Reserved for inherently not codable concepts without codable children: Secondary | ICD-10-CM | POA: Insufficient documentation

## 2013-02-18 DIAGNOSIS — M25569 Pain in unspecified knee: Secondary | ICD-10-CM | POA: Insufficient documentation

## 2013-02-18 DIAGNOSIS — M25669 Stiffness of unspecified knee, not elsewhere classified: Secondary | ICD-10-CM | POA: Insufficient documentation

## 2013-02-25 ENCOUNTER — Ambulatory Visit: Payer: Medicare Other | Admitting: Physical Therapy

## 2013-07-23 ENCOUNTER — Other Ambulatory Visit: Payer: Self-pay

## 2014-03-11 ENCOUNTER — Telehealth: Payer: Self-pay | Admitting: Family Medicine

## 2014-03-11 ENCOUNTER — Telehealth: Payer: Self-pay | Admitting: Emergency Medicine

## 2014-03-11 NOTE — Telephone Encounter (Signed)
Pt. Has gout and it has been flaring up, pt. Needs refill for indomethacin...please call patient as soon as possible

## 2014-03-11 NOTE — Telephone Encounter (Signed)
Returned pt call for medication refill for Gout flare up. Pt has not been seen in clinic since 01/2013. Informed pt we will address his flare up at time of scheduled appt 03/22/14. Pt was very upset and hung up.

## 2014-03-22 ENCOUNTER — Ambulatory Visit: Payer: Medicare Other | Admitting: Internal Medicine

## 2014-05-27 ENCOUNTER — Telehealth: Payer: Self-pay | Admitting: Internal Medicine

## 2014-05-27 NOTE — Telephone Encounter (Signed)
Per pt: dr Jerrye Beavers saw his mother today-Glenda Tabor.   Dr told mother he would see son as a patient--just to call and book him Please verify

## 2014-05-31 NOTE — Telephone Encounter (Signed)
Please schedule Mr Sallyanne Havers as a new patient with Dyanna Seiter for first available appointment. Thank you.

## 2014-09-28 ENCOUNTER — Encounter: Payer: Self-pay | Admitting: Family Medicine

## 2014-09-28 NOTE — Progress Notes (Unsigned)
Disability Determination Services [place] [employer] Evaluator: Harlene Saltsavid [place], [place].  [facility], Candor [place]. Date of Evaluation: 09/13/14 Patient: Drew [name], DOB 04/02/1966   Pt brought to evaluation by his brother.   ABSTRACTION of document by Dr Tawanna Coolerodd Rudell Ortman, MD  Referral: Determination of eligibility for disability benefits.   Clinical Information entered into relevant sections of EMR.   Other clinical information Socially withdrawn. Avoidant tendencies.  Paranoid tendencies.  Irritable.  Feels judged by others. Impaired coping strategies.  No hallucinations.    Difficulty Falling asleep but no EMA. No exercise. No social activities.  Weight gain.  Worries about his mother, and becoming homeless, losing disability payments for his mother.   Good Mental Status testing for General info, semantic memory good while episodic is less so, Adequate immediate attention,  Adequate simple calculations.  Fair level of abstraction.   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  Summary & Conclusions  Pt capable of understanding insturction to perform simple rountine repetitive tasks.  Fair Pharmacist, communitysocial skills and ability to interact appropriately. Medical issues affecting his ability to work needs to be determined by physician.  Referral to Vocational Rehab recommended if cleared to work medically Guidance regarding finances would be needed if awarded funds due to his continued use of alcohol

## 2021-02-10 ENCOUNTER — Encounter (HOSPITAL_COMMUNITY): Payer: Self-pay

## 2021-02-10 ENCOUNTER — Other Ambulatory Visit: Payer: Self-pay

## 2021-02-10 ENCOUNTER — Emergency Department (HOSPITAL_COMMUNITY)
Admission: EM | Admit: 2021-02-10 | Discharge: 2021-02-10 | Disposition: A | Discharge status: Home / Self Care | Payer: Medicare Other | Attending: Physician Assistant | Admitting: Physician Assistant

## 2021-02-10 DIAGNOSIS — J45909 Unspecified asthma, uncomplicated: Secondary | ICD-10-CM | POA: Diagnosis not present

## 2021-02-10 DIAGNOSIS — M79674 Pain in right toe(s): Secondary | ICD-10-CM | POA: Diagnosis present

## 2021-02-10 MED ORDER — PREDNISONE 50 MG PO TABS: 50.0000 mg | ORAL_TABLET | Freq: Every day | ORAL | 0 refills | Status: AC

## 2021-02-10 MED ORDER — OXYCODONE HCL 5 MG PO TABS: 5.0000 mg | ORAL_TABLET | Freq: Four times a day (QID) | ORAL | 0 refills | Status: AC | PRN

## 2021-02-10 NOTE — ED Triage Notes (Signed)
Pt states he has gout  In his right big toe.

## 2021-02-10 NOTE — Discharge Instructions (Signed)
Take medication as prescribed  Do not drive or operate heavy machinery while taking the narcotic medication oxycodone. This may become addictive  Return for new or worsening symptoms

## 2021-02-10 NOTE — ED Provider Notes (Signed)
[facility] EMERGENCY DEPARTMENT Provider Note   CSN: [id number] Arrival date & time: 02/10/21  1210     History Great toe pain   Drew [name] is a 55 y.o. male with past medical history significant for gout who presents for evaluation of gout.  Has had this multiple times previously.  States feels similar to prior.  He has noticed some redness and some swelling to his right great toe.  No recent injury or trauma.  He denies any prior history of diabetes.  Has been trying ibuprofen at home without relief.  He is not currently followed by PCP.  He denies fever, chills, nausea, vomiting, paresthesias, weakness, rashes or lesions.  Denies additional aggravating or alleviating factors.  No prior history of osteomyelitis.  History obtained from patient and past medical records.  No interpreter used HPI     Past Medical History:  Diagnosis Date  . ADHD (attention deficit hyperactivity disorder)   . Asthma   . Astigmatism   . Borderline intellectual functioning    History of Special Education Classes. Full Scale IQ 74 in 2001.   . Degenerative cervical disc   . Depression    Has seen Psychiatrist and counselor in past  . Dysthymic disorder   . Gout   . Myopia   . Opiate dependence (HCC)    Treated with Methadone maintenance in past.   . Personality disorder (HCC)    NOS  . Spondylosis     Patient Active Problem List   Diagnosis Date Noted  . Prediabetes 01/31/2013  . Left knee pain 01/31/2013  . Gout attack 01/30/2013  . GLUCOSE INTOLERANCE 11/14/2006  . GOUT, ACUTE 11/14/2006  . OBESITY, NOS 11/14/2006  . NEUROPATHY, PERIPHERAL 11/14/2006  . HEARING LOSS NOS OR DEAFNESS 11/14/2006  . DJD, UNSPECIFIED 11/14/2006  . TRANSAMINESES ELEVATED 11/14/2006    Past Surgical History:  Procedure Laterality Date  . BACK SURGERY     2006  . FOOT SURGERY         History reviewed. No pertinent family history.  Social History   Substance Use Topics  .  Alcohol use: Yes    Alcohol/week: 0.0 standard drinks    Comment: History of two DWI convictions in 2011 to 2014. History of Detoxification hospitalization at [facility]  History of opiate dependence treated with Methadone maintenance.   . Drug use: No    Home Medications Prior to Admission medications   Medication Sig Start Date End Date Taking? Authorizing Provider  oxyCODONE (ROXICODONE) 5 MG immediate release tablet Take 1 tablet (5 mg total) by mouth every 6 (six) hours as needed for up to 3 days for severe pain. 02/10/21 02/13/21 Yes Loree Shehata A, PA-C  predniSONE (DELTASONE) 50 MG tablet Take 1 tablet (50 mg total) by mouth daily for 5 days. 02/10/21 02/15/21 Yes Sylvania Moss A, PA-C  colchicine 0.6 MG tablet Take 1 tablet (0.6 mg total) by mouth daily. Take 2 tabs (1.2mg ) today, then 0.6mg  daily for 7 days 02/11/13   Rai, Delene Ruffini, MD  HYDROcodone-acetaminophen (NORCO) 5-325 MG per tablet Take 1 tablet by mouth every 8 (eight) hours as needed for pain. 02/11/13   Rai, Delene Ruffini, MD  naproxen (NAPROSYN) 500 MG tablet Take 1 tablet (500 mg total) by mouth 2 (two) times daily with a meal. 02/11/13   Rai, Ripudeep K, MD    Allergies    Aspirin adult low [aspirin]  Review of Systems   Review of Systems  Constitutional: Negative.   HENT: Negative.   Respiratory: Negative.   Cardiovascular: Negative.   Gastrointestinal: Negative.   Genitourinary: Negative.   Musculoskeletal:       Right great toe pain at [facility]  Skin: Negative.   Neurological: Negative.   All other systems reviewed and are negative.   Physical Exam Updated Vital Signs BP (!) 154/108 (BP Location: Right Arm)   Pulse (!) 107   Temp 99.2 F (37.3 C)   Resp 17   SpO2 98%   Physical Exam Vitals and nursing note reviewed.  Constitutional:      General: He is not in acute distress.    Appearance: He is well-developed. He is not ill-appearing, toxic-appearing or diaphoretic.  HENT:     Head:  Normocephalic and atraumatic.     Nose: Nose normal.     Mouth/Throat:     Mouth: Mucous membranes are moist.  Eyes:     Pupils: Pupils are equal, round, and reactive to light.  Cardiovascular:     Rate and Rhythm: Normal rate and regular rhythm.     Pulses: Normal pulses.     Heart sounds: Normal heart sounds.  Pulmonary:     Effort: Pulmonary effort is normal. No respiratory distress.     Breath sounds: Normal breath sounds.  Abdominal:     General: Bowel sounds are normal. There is no distension.     Palpations: Abdomen is soft.  Musculoskeletal:        General: Normal range of motion.     Cervical back: Normal range of motion and neck supple.     Comments: Tenderness to right great toe metacarpal.  Some slight swelling and mild erythema.  No bony tenderness to mid, proximal foot.  No ecchymosis, skin lesions.  No ulcerations.  Skin:    General: Skin is warm and dry.     Capillary Refill: Capillary refill takes less than 2 seconds.     Comments: No streaking erythema, no ecchymosis, fluctuance or induration   Neurological:     General: No focal deficit present.     Mental Status: He is alert and oriented to person, place, and time.     Comments: Ambulatory Intact sensation Equal strength     ED Results / Procedures / Treatments   Labs (all labs ordered are listed, but only abnormal results are displayed) Labs Reviewed - No data to display  EKG None  Radiology No results found.  Procedures Procedures   Medications Ordered in ED Medications - No data to display  ED Course  I have reviewed the triage vital signs and the nursing notes.  Pertinent labs & imaging results that were available during my care of the patient were reviewed by me and considered in my medical decision making (see chart for details).   55 year old here for evaluation of right great toe pain which patient feels is consistent with his prior episodes of gout.  He has had joint aspirations  previously with confirmed gout.  Has been taking ibuprofen without relief.  No recent injury or trauma.  He is neurovascularly intact.  No bony tenderness to mid, proximal foot.  He is ambulatory.  No ulcerations.  Exam consistent with likely gouty arthritis first metacarpal.  I have low suspicion for osteomyelitis, septic joint, occult fracture, dislocation, myositis, vascular occlusion, cellulitis, abscess.  DC home with steroids, pain management.  Was referred to outpatient provider for further follow-up.  He will return for any worsening symptoms.  I  do not feel he needs imaging at this time  The patient has been appropriately medically screened and/or stabilized in the ED. I have low suspicion for any other emergent medical condition which would require further screening, evaluation or treatment in the ED or require inpatient management.  Patient is hemodynamically stable and in no acute distress.  Patient able to ambulate in department prior to ED.  Evaluation does not show acute pathology that would require ongoing or additional emergent interventions while in the emergency department or further inpatient treatment.  I have discussed the diagnosis with the patient and answered all questions.  Pain is been managed while in the emergency department and patient has no further complaints prior to discharge.  Patient is comfortable with plan discussed in room and is stable for discharge at this time.  I have discussed strict return precautions for returning to the emergency department.  Patient was encouraged to follow-up with PCP/specialist refer to at discharge.    MDM Rules/Calculators/A&P                           Final Clinical Impression(s) / ED Diagnoses Final diagnoses:  Pain of right great toe    Rx / DC Orders ED Discharge Orders         Ordered    predniSONE (DELTASONE) 50 MG tablet  Daily        02/10/21 1227    oxyCODONE (ROXICODONE) 5 MG immediate release tablet  Every 6 hours  PRN        02/10/21 1227           Chevez Sambrano A, PA-C 02/10/21 1229    Derwood Kaplan, MD 02/13/21 1544

## 2021-02-28 ENCOUNTER — Emergency Department (HOSPITAL_COMMUNITY): Payer: Medicare Other

## 2021-02-28 ENCOUNTER — Emergency Department (HOSPITAL_COMMUNITY)
Admission: EM | Admit: 2021-02-28 | Discharge: 2021-02-28 | Disposition: A | Discharge status: Home / Self Care | Payer: Medicare Other | Attending: Emergency Medicine | Admitting: Emergency Medicine

## 2021-02-28 ENCOUNTER — Other Ambulatory Visit: Payer: Self-pay

## 2021-02-28 ENCOUNTER — Encounter (HOSPITAL_COMMUNITY): Payer: Self-pay

## 2021-02-28 DIAGNOSIS — M25511 Pain in right shoulder: Secondary | ICD-10-CM | POA: Insufficient documentation

## 2021-02-28 DIAGNOSIS — J45909 Unspecified asthma, uncomplicated: Secondary | ICD-10-CM | POA: Insufficient documentation

## 2021-02-28 MED ORDER — METHOCARBAMOL 500 MG PO TABS: 500.0000 mg | ORAL_TABLET | Freq: Two times a day (BID) | ORAL | 0 refills | Status: AC

## 2021-02-28 MED ORDER — KETOROLAC TROMETHAMINE 15 MG/ML IJ SOLN
30.0000 mg | Freq: Once | INTRAMUSCULAR | Status: AC
  Administered 2021-02-28: 30 mg via INTRAMUSCULAR
  Filled 2021-02-28: qty 2

## 2021-02-28 NOTE — Discharge Instructions (Addendum)
Your shoulder pain is consistent with either biceps tendinitis, muscle strain or other tendinopathy.  These kinds of injuries often require physical therapy.  Please take Tylenol and ibuprofen as discussed below. I have written down the max doses for Tylenol ibuprofen.  You do not need to take the highest dose.  He may titrate down.  The ibuprofen will likely help significantly with the inflammation.  Please use cool compresses and follow-up with [facility].  Please use Tylenol or ibuprofen for pain.  You may use 600 mg ibuprofen every 6 hours or 1000 mg of Tylenol every 6 hours.  You may choose to alternate between the 2.  This would be most effective.  Not to exceed 4 g of Tylenol within 24 hours.  Not to exceed 3200 mg ibuprofen 24 hours.

## 2021-02-28 NOTE — ED Provider Notes (Signed)
Emergency Medicine Provider Triage Evaluation Note  Drew [name] , a 55 y.o. male  was evaluated in triage.  Pt complains of shoulder pain  Review of Systems  Positive: Shoulder pain for 4 months Negative: No fever  Physical Exam  BP (!) 132/95 (BP Location: Left Arm)   Pulse (!) 107   Temp 98.7 F (37.1 C)   Resp 16   SpO2 99%  Gen:   Awake, no distress   Resp:  Normal effort  MSK:   Tender left shoulder. Pain with movement Other:    Medical Decision Making  Medically screening exam initiated at 12:51 PM.  Appropriate orders placed.  Frankey Shown was informed that the remainder of the evaluation will be completed by another provider, this initial triage assessment does not replace that evaluation, and the importance of remaining in the ED until their evaluation is complete.     Osie Cheeks 02/28/21 1252    Gerhard Munch, MD 03/01/21 [phone]

## 2021-02-28 NOTE — ED Provider Notes (Signed)
[facility] EMERGENCY DEPARTMENT Provider Note   CSN: [id number] Arrival date & time: 02/28/21  1203     History Chief Complaint  Patient presents with   Shoulder Pain    Drew [name] is a 55 y.o. male.  HPI Patient is complaining of 4 months of right shoulder pain.  He has a past medical history detailed below.  Patient states it is achy and seems he worse with movement.  Specially over the head movement.  He states he sleeps on his right side usually and has been having some pain with difficulty falling asleep when he lays on his right side.  He states that when his pain for started he was sleeping on his right side and he felt that a small popping sensation occurred.  He states he was afraid he dislocated and relocated.  He denies any numbness or weakness in his arm.  No other injuries besides the popping sensation when he was lying in bed.  No trauma.  Denies any chest pain shortness of breath  No other significant associated symptoms.  No aggravating mitigating factors apart from movement and overhead reaching.  Has taken no medications prior to arrival.     Past Medical History:  Diagnosis Date   ADHD (attention deficit hyperactivity disorder)    Asthma    Astigmatism    Borderline intellectual functioning    History of Special Education Classes. Full Scale IQ 74 in 2001.    Degenerative cervical disc    Depression    Has seen Psychiatrist and counselor in past   Dysthymic disorder    Gout    Myopia    Opiate dependence (HCC)    Treated with Methadone maintenance in past.    Personality disorder (HCC)    NOS   Spondylosis     Patient Active Problem List   Diagnosis Date Noted   Prediabetes 01/31/2013   Left knee pain 01/31/2013   Gout attack 01/30/2013   GLUCOSE INTOLERANCE 11/14/2006   GOUT, ACUTE 11/14/2006   OBESITY, NOS 11/14/2006   NEUROPATHY, PERIPHERAL 11/14/2006   HEARING LOSS NOS OR DEAFNESS 11/14/2006   DJD, UNSPECIFIED  11/14/2006   TRANSAMINESES ELEVATED 11/14/2006    Past Surgical History:  Procedure Laterality Date   BACK SURGERY     2006   FOOT SURGERY         History reviewed. No pertinent family history.  Social History   Tobacco Use   Smoking status: Never   Smokeless tobacco: Never  Substance Use Topics   Alcohol use: Yes    Alcohol/week: 0.0 standard drinks    Comment: History of two DWI convictions in 2011 to 2014. History of Detoxification hospitalization at [facility]  History of opiate dependence treated with Methadone maintenance.    Drug use: No    Home Medications Prior to Admission medications   Medication Sig Start Date End Date Taking? Authorizing Provider  colchicine 0.6 MG tablet Take 1 tablet (0.6 mg total) by mouth daily. Take 2 tabs (1.2mg ) today, then 0.6mg  daily for 7 days 02/11/13   Rai, Delene Ruffini, MD  HYDROcodone-acetaminophen (NORCO) 5-325 MG per tablet Take 1 tablet by mouth every 8 (eight) hours as needed for pain. 02/11/13   Rai, Delene Ruffini, MD  naproxen (NAPROSYN) 500 MG tablet Take 1 tablet (500 mg total) by mouth 2 (two) times daily with a meal. 02/11/13   Rai, Ripudeep K, MD    Allergies    Aspirin adult low [  aspirin]  Review of Systems   Review of Systems  Constitutional:  Negative for chills and fever.  HENT:  Negative for congestion.   Respiratory:  Negative for shortness of breath.   Cardiovascular:  Negative for chest pain.  Gastrointestinal:  Negative for abdominal pain.  Musculoskeletal:  Negative for neck pain.       Right shoulder pain   Physical Exam Updated Vital Signs BP (!) 126/93 (BP Location: Left Arm)   Pulse 97   Temp 98.7 F (37.1 C)   Resp 18   SpO2 98%   Physical Exam Vitals and nursing note reviewed.  Constitutional:      General: He is not in acute distress.    Appearance: Normal appearance. He is not ill-appearing.  HENT:     Head: Normocephalic and atraumatic.     Mouth/Throat:     Mouth: Mucous membranes  are moist.  Eyes:     General: No scleral icterus.       Right eye: No discharge.        Left eye: No discharge.     Conjunctiva/sclera: Conjunctivae normal.  Cardiovascular:     Comments: Pulses 3+ in  bilateral radial artery Pulmonary:     Effort: Pulmonary effort is normal.     Breath sounds: No stridor.  Musculoskeletal:     Comments: No focal tenderness palpation of the bony upper extremities.  There is some tenderness to palpation of the right deltoid muscle. Full range of motion of right shoulder flexion-extension 5/5 strength Hawkins positive on right shoulder  Neurological:     Mental Status: He is alert and oriented to person, place, and time. Mental status is at baseline.    ED Results / Procedures / Treatments   Labs (all labs ordered are listed, but only abnormal results are displayed) Labs Reviewed - No data to display  EKG None  Radiology DG Shoulder Right  Result Date: 02/28/2021 CLINICAL DATA:  Pain. Additional history provided: Patient reports right shoulder pain after rolling over in sleep and feeling as though his right shoulder "popped out of place and then back in." EXAM: RIGHT SHOULDER - 2+ VIEW COMPARISON:  None FINDINGS: There is normal bony alignment. No evidence of acute osseous or articular abnormality. The joint spaces are maintained. IMPRESSION: No evidence of acute osseous or articular abnormality. Electronically Signed   By: Jackey Loge DO   On: 02/28/2021 13:18    Procedures Procedures   Medications Ordered in ED Medications  ketorolac (TORADOL) 15 MG/ML injection 30 mg (has no administration in time range)    ED Course  I have reviewed the triage vital signs and the nursing notes.  Pertinent labs & imaging results that were available during my care of the patient were reviewed by me and considered in my medical decision making (see chart for details).    MDM Rules/Calculators/A&P                          Patient is a 55 year old  gentleman with approximately 4 months of right shoulder pain has been ongoing he felt a pop in his shoulder and sleep.  Denies any numbness or weakness in arm.  He has good sensation he is distally neurovascularly intact.  Seems to have pain when he lifts his shoulder above his right head concern for subluxation versus biceps tendinitis/tendinopathy.   No evidence of acute osseous or articular abnormality on x-ray  Has good sensation and  movement.  Exam is overall reassuring.  Suspect tendinitis/tendinopathy.  May be subluxation of right shoulder.  Patient given 1 dose of Toradol we will follow-up with [facility] for physical therapy and follow-up appointment.  Final Clinical Impression(s) / ED Diagnoses Final diagnoses:  Acute pain of right shoulder    Rx / DC Orders ED Discharge Orders     None        Gailen Shelter, [place] 03/01/21 0009    Linwood Dibbles, MD 03/01/21 1108

## 2021-02-28 NOTE — ED Triage Notes (Signed)
Pt reports Right shoulder pain x4 months or more. Pt feels he popped it in his sleep. Denies any recent treatment for the same . Able to wiggle fingers, sharp pain if he lifts right arm above his head, no decrease in sensation.

## 2021-08-09 IMAGING — CR DG SHOULDER 2+V*R*
4 series · 4 of 4 positions shown · non-contrast
Comparison: None

CLINICAL DATA: Pain. Additional history provided: Patient reports
right shoulder pain after rolling over in sleep and feeling as
though his right shoulder "popped out of place and then back in."

EXAM:
RIGHT SHOULDER - 2+ VIEW

[shoulder grashey]
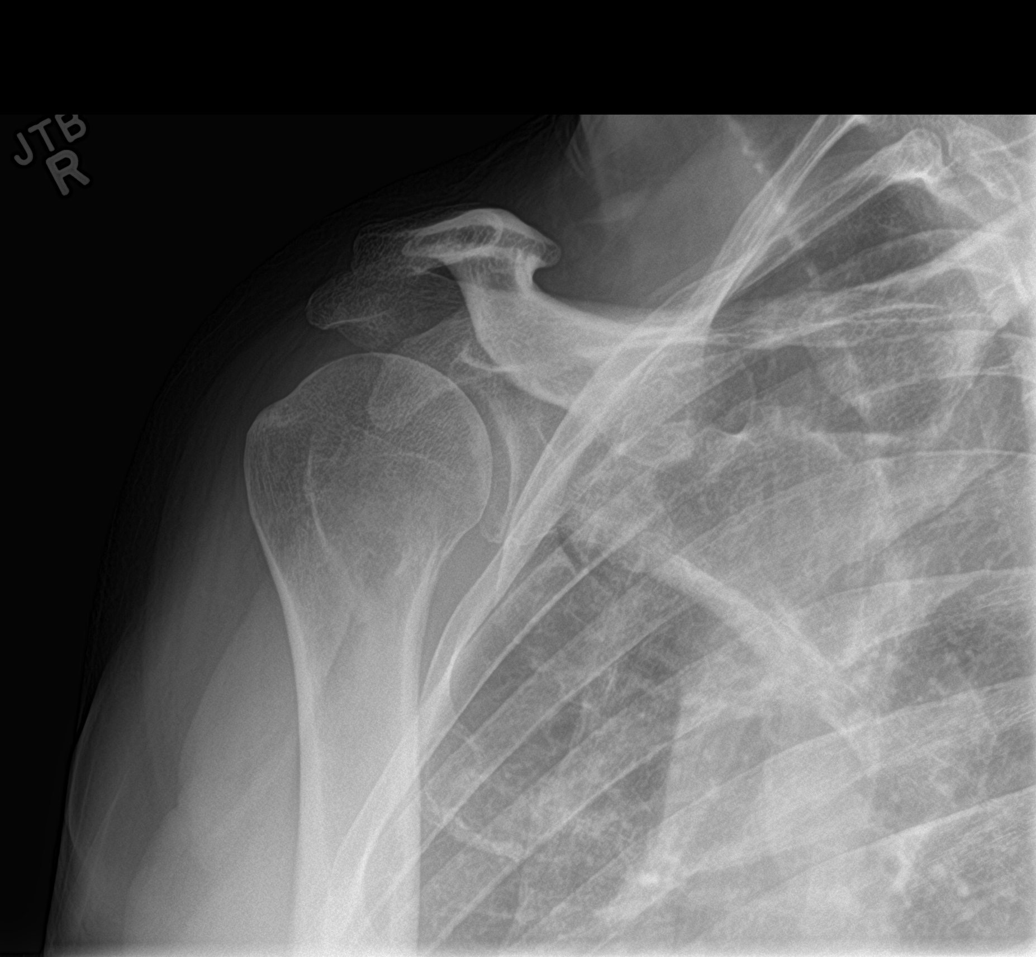

[shoulder y view]
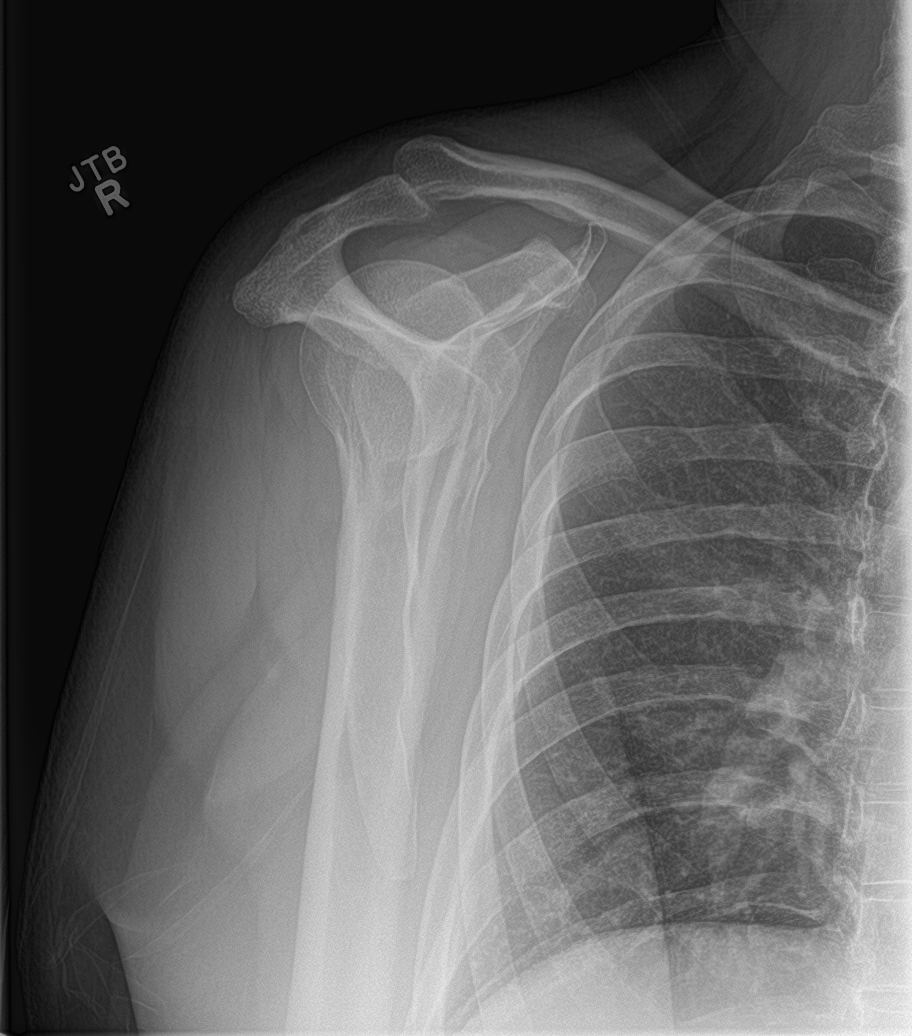

[shoulder axillary]
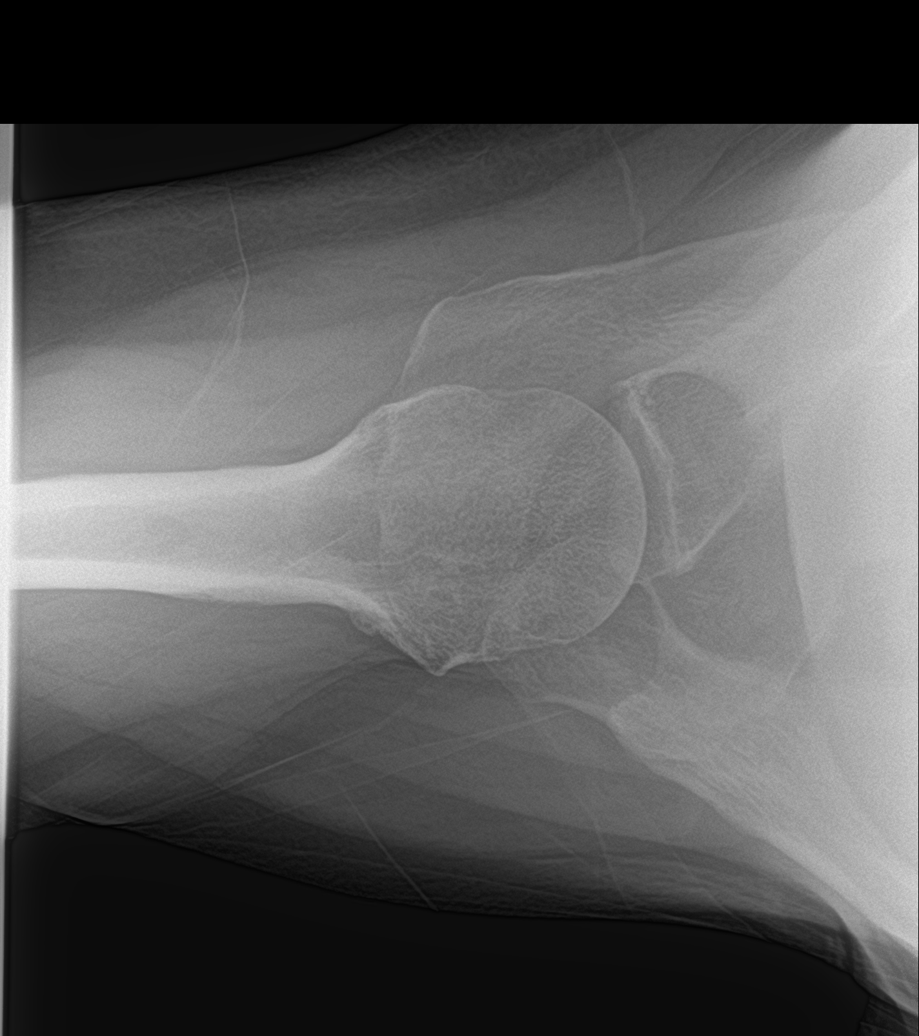

[shoulder ap neutral]
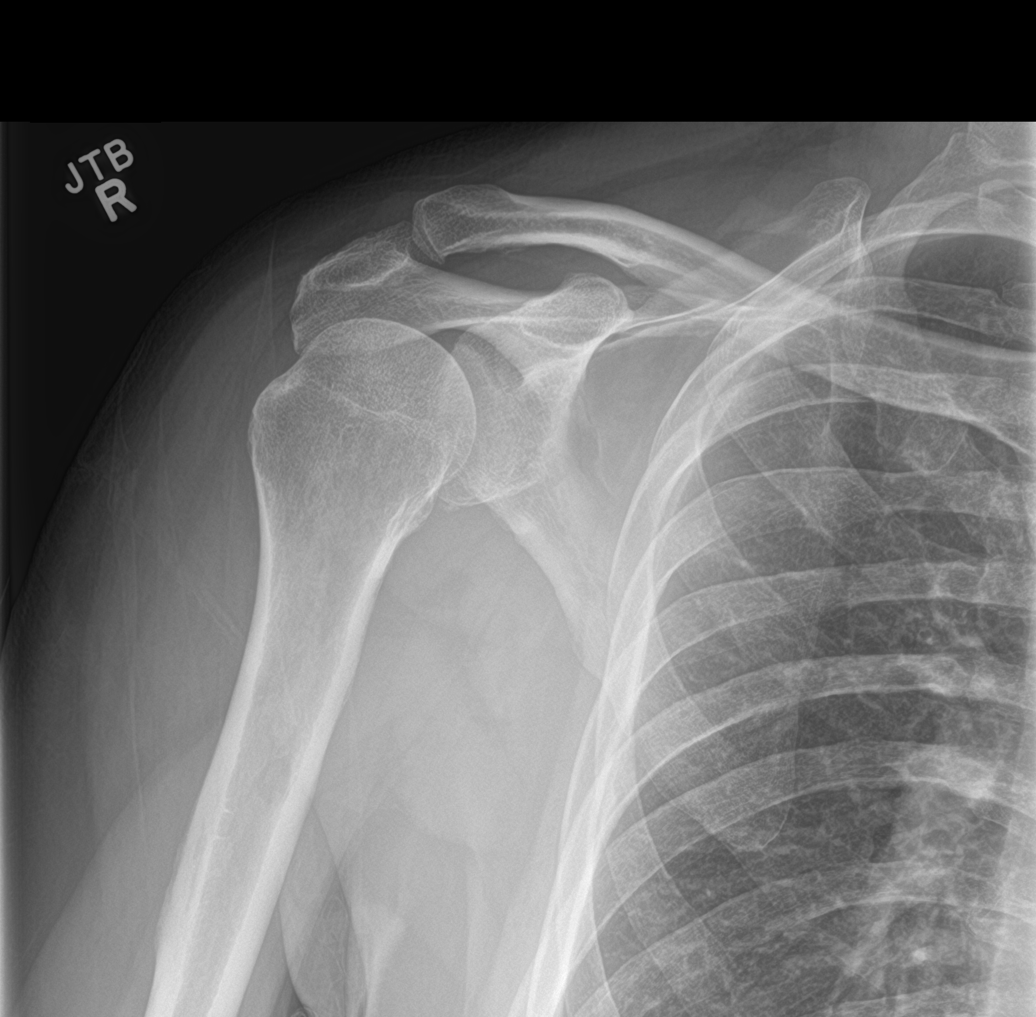

[4 of 4 positions shown; findings below may reference images not displayed]

FINDINGS: There is normal bony alignment.

No evidence of acute osseous or articular abnormality.

The joint spaces are maintained.
IMPRESSION: No evidence of acute osseous or articular abnormality.
